# Patient Record
Sex: Male | Born: 1990 | Race: White | Hispanic: No | Marital: Single | Smoking: Never smoker
Health system: Southern US, Community
[De-identification: ages and names within clinical notes are randomized; demographics above are authoritative.]

---
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
## 2015-08-11 ENCOUNTER — Emergency Department (HOSPITAL_COMMUNITY)
Admission: EM | Admit: 2015-08-11 | Discharge: 2015-08-11 | Disposition: A | Discharge status: Home / Self Care | Payer: Self-pay | Attending: Emergency Medicine | Admitting: Emergency Medicine

## 2015-08-11 ENCOUNTER — Encounter (HOSPITAL_COMMUNITY): Payer: Self-pay | Admitting: *Deleted

## 2015-08-11 DIAGNOSIS — R42 Dizziness and giddiness: Secondary | ICD-10-CM | POA: Insufficient documentation

## 2015-08-11 DIAGNOSIS — E860 Dehydration: Secondary | ICD-10-CM | POA: Insufficient documentation

## 2015-08-11 LAB — CBC WITH DIFFERENTIAL/PLATELET
BASOS ABS: 0 10*3/uL (ref 0.0–0.1)
BASOS PCT: 0 %
Eosinophils Absolute: 0.1 10*3/uL (ref 0.0–0.7)
Eosinophils Relative: 2 %
HEMATOCRIT: 41.4 % (ref 39.0–52.0)
Hemoglobin: 14.9 g/dL (ref 13.0–17.0)
Lymphocytes Relative: 40 %
Lymphs Abs: 2.4 10*3/uL (ref 0.7–4.0)
MCH: 32.6 pg (ref 26.0–34.0)
MCHC: 36 g/dL (ref 30.0–36.0)
MCV: 90.6 fL (ref 78.0–100.0)
MONO ABS: 0.6 10*3/uL (ref 0.1–1.0)
Monocytes Relative: 10 %
NEUTROS ABS: 2.9 10*3/uL (ref 1.7–7.7)
Neutrophils Relative %: 48 %
PLATELETS: 200 10*3/uL (ref 150–400)
RBC: 4.57 MIL/uL (ref 4.22–5.81)
RDW: 12.1 % (ref 11.5–15.5)
WBC: 6 10*3/uL (ref 4.0–10.5)

## 2015-08-11 LAB — COMPREHENSIVE METABOLIC PANEL
ALBUMIN: 4.5 g/dL (ref 3.5–5.0)
ALK PHOS: 46 U/L (ref 38–126)
ALT: 10 U/L — ABNORMAL LOW (ref 17–63)
ANION GAP: 4 — AB (ref 5–15)
AST: 21 U/L (ref 15–41)
BILIRUBIN TOTAL: 1.1 mg/dL (ref 0.3–1.2)
BUN: 13 mg/dL (ref 6–20)
CALCIUM: 8.8 mg/dL — AB (ref 8.9–10.3)
CO2: 28 mmol/L (ref 22–32)
Chloride: 108 mmol/L (ref 101–111)
Creatinine, Ser: 0.92 mg/dL (ref 0.61–1.24)
GLUCOSE: 80 mg/dL (ref 65–99)
Potassium: 3.7 mmol/L (ref 3.5–5.1)
Sodium: 140 mmol/L (ref 135–145)
TOTAL PROTEIN: 7.7 g/dL (ref 6.5–8.1)

## 2015-08-11 LAB — URINALYSIS, ROUTINE W REFLEX MICROSCOPIC
BILIRUBIN URINE: NEGATIVE
Glucose, UA: NEGATIVE mg/dL
HGB URINE DIPSTICK: NEGATIVE
Ketones, ur: NEGATIVE mg/dL
Leukocytes, UA: NEGATIVE
Nitrite: NEGATIVE
PROTEIN: NEGATIVE mg/dL
Specific Gravity, Urine: >1.03 — ABNORMAL HIGH (ref 1.005–1.030)
UROBILINOGEN UA: 1 mg/dL (ref 0.0–1.0)
pH: 6 (ref 5.0–8.0)

## 2015-08-11 LAB — TROPONIN I: Troponin I: <0.03 ng/mL (ref <0.031)

## 2015-08-11 NOTE — ED Notes (Signed)
Patient in gown and placed on cardiac monitoring. EKG done and given to EDP Dr Lynelle Doctor

## 2015-08-11 NOTE — Discharge Instructions (Signed)
Drink a bottle of Gatorade tonight and again in the morning. If you are not feeling better after that you may want to be reevaluated. Return to the ED if you get worse such as headache, falling, vomiting, or change in vision.   Dizziness Dizziness is a common problem. It is a feeling of unsteadiness or light-headedness. You may feel like you are about to faint. Dizziness can lead to injury if you stumble or fall. A person of any age group can suffer from dizziness, but dizziness is more common in older adults. CAUSES  Dizziness can be caused by many different things, including:  Middle ear problems.  Standing for too long.  Infections.  An allergic reaction.  Aging.  An emotional response to something, such as the sight of blood.  Side effects of medicines.  Tiredness.  Problems with circulation or blood pressure.  Excessive use of alcohol or medicines, or illegal drug use.  Breathing too fast (hyperventilation).  An irregular heart rhythm (arrhythmia).  A low red blood cell count (anemia).  Pregnancy.  Vomiting, diarrhea, fever, or other illnesses that cause body fluid loss (dehydration).  Diseases or conditions such as Parkinson's disease, high blood pressure (hypertension), diabetes, and thyroid problems.  Exposure to extreme heat. DIAGNOSIS  Your health care provider will ask about your symptoms, perform a physical exam, and perform an electrocardiogram (ECG) to record the electrical activity of your heart. Your health care provider may also perform other heart or blood tests to determine the cause of your dizziness. These may include:  Transthoracic echocardiogram (TTE). During echocardiography, sound waves are used to evaluate how blood flows through your heart.  Transesophageal echocardiogram (TEE).  Cardiac monitoring. This allows your health care provider to monitor your heart rate and rhythm in real time.  Holter monitor. This is a portable device that  records your heartbeat and can help diagnose heart arrhythmias. It allows your health care provider to track your heart activity for several days if needed.  Stress tests by exercise or by giving medicine that makes the heart beat faster. TREATMENT  Treatment of dizziness depends on the cause of your symptoms and can vary greatly. HOME CARE INSTRUCTIONS   Drink enough fluids to keep your urine clear or pale yellow. This is especially important in very hot weather. In older adults, it is also important in cold weather.  Take your medicine exactly as directed if your dizziness is caused by medicines. When taking blood pressure medicines, it is especially important to get up slowly.  Rise slowly from chairs and steady yourself until you feel okay.  In the morning, first sit up on the side of the bed. When you feel okay, stand slowly while holding onto something until you know your balance is fine.  Move your legs often if you need to stand in one place for a long time. Tighten and relax your muscles in your legs while standing.  Have someone stay with you for 1-2 days if dizziness continues to be a problem. Do this until you feel you are well enough to stay alone. Have the person call your health care provider if he or she notices changes in you that are concerning.  Do not drive or use heavy machinery if you feel dizzy.  Do not drink alcohol. SEEK IMMEDIATE MEDICAL CARE IF:   Your dizziness or light-headedness gets worse.  You feel nauseous or vomit.  You have problems talking, walking, or using your arms, hands, or legs.  You  feel weak.  You are not thinking clearly or you have trouble forming sentences. It may take a friend or family member to notice this.  You have chest pain, abdominal pain, shortness of breath, or sweating.  Your vision changes.  You notice any bleeding.  You have side effects from medicine that seems to be getting worse rather than better. MAKE SURE YOU:     Understand these instructions.  Will watch your condition.  Will get help right away if you are not doing well or get worse. Document Released: 05/03/2001 Document Revised: 11/12/2013 Document Reviewed: 05/27/2011 [facility] Patient Information 2015 [place], [place]. This information is not intended to replace advice given to you by your health care provider. Make sure you discuss any questions you have with your health care provider.

## 2015-08-11 NOTE — ED Provider Notes (Signed)
CSN: [id number]     Arrival date & time 08/11/15  0125 History   First MD Initiated Contact with Patient 08/11/15 0145     Chief Complaint  Patient presents with  . Dizziness     (Consider location/radiation/quality/duration/timing/severity/associated sxs/prior Treatment) HPI when I go into room and asked the patient was going on he states "not much". 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 He states he feels that way constantly and it does not matter if he standing or laying down. He denies any feeling of spinning but states he feels like he's off balance. His mother states he was at work [employer] and he got clammy and pale when he felt like he was going to pass out. They state their work environment is not hot. He ate like he normally does during the day today. He denies headache, chest pain, shortness of breath, nausea, vomiting, or diarrhea. He does report feeling shaky. He has not been around anybody else who is ill.  PCP none  History reviewed. No pertinent past medical history. History reviewed. No pertinent past surgical history. No family history on file. Social History  Substance Use Topics  . Smoking status: Never Smoker   . Smokeless tobacco: None  . Alcohol Use: Yes  employed Lives with mother  Review of Systems  All other systems reviewed and are negative.     Allergies  Review of patient's allergies indicates no known allergies.  Home Medications   Prior to Admission medications   Not on File   BP 131/94 mmHg  Pulse 66  Temp(Src) 98.5 F (36.9 C) (Oral)  Resp 18  Ht  (1.676 m)  Wt 140 lb 9 oz (63.759 kg)  BMI 22.70 kg/m2  SpO2 99%  Vital signs normal   Physical Exam  Constitutional: He is oriented to person, place, and time. He appears well-developed and well-nourished.  Non-toxic appearance. He does not appear ill. No distress.  HENT:  Head: Normocephalic and  atraumatic.  Right Ear: External ear normal.  Left Ear: External ear normal.  Nose: Nose normal. No mucosal edema or rhinorrhea.  Mouth/Throat: Mucous membranes are normal. No dental abscesses or uvula swelling.  Tongue slightly dry  Eyes: Conjunctivae and EOM are normal. Pupils are equal, round, and reactive to light.  Neck: Normal range of motion and full passive range of motion without pain. Neck supple.  Cardiovascular: Normal rate, regular rhythm and normal heart sounds.  Exam reveals no gallop and no friction rub.   No murmur heard. Pulmonary/Chest: Effort normal and breath sounds normal. No respiratory distress. He has no wheezes. He has no rhonchi. He has no rales. He exhibits no tenderness and no crepitus.  Abdominal: Soft. Normal appearance and bowel sounds are normal. He exhibits no distension. There is no tenderness. There is no rebound and no guarding.  Musculoskeletal: Normal range of motion. He exhibits no edema or tenderness.  Moves all extremities well.   Neurological: He is alert and oriented to person, place, and time. He has normal strength. No cranial nerve deficit.  Skin: Skin is warm, dry and intact. No rash noted. No erythema. There is pallor.  Psychiatric: He has a normal mood and affect. His speech is normal and behavior is normal. His mood appears not anxious.  Nursing note and vitals reviewed.   ED Course  Procedures (including critical care time)   02:07:16 Orthostatic Vital  Signs Orthostatic Lying  - BP- Lying: 127/85 mmHg ; Pulse- Lying: 64  Orthostatic Sitting - BP- Sitting: 134/93 mmHg ; Pulse- Sitting: 72  Orthostatic Standing at 0 minutes - BP- Standing at 0 minutes: 135/95 mmHg ; Pulse- Standing at 0 minutes: 72     Patient was given his test results. Does appear to have concentrated urine consistent with dehydration. He was given the option of getting an IV and getting IV fluids in the ED or going home and drinking Gatorade. He states he has Gatorade  at home and prefers to do that. At this point I do not see any other alarming signs, although he states he feels off balance his gait and coordination are normal. CT head considered but not done. Patient was playing games on his cell phone when I go back to the room.   Labs Review Results for orders placed or performed during the hospital encounter of 08/11/15  Comprehensive metabolic panel  Result Value Ref Range   Sodium 140 135 - 145 mmol/L   Potassium 3.7 3.5 - 5.1 mmol/L   Chloride 108 101 - 111 mmol/L   CO2 28 22 - 32 mmol/L   Glucose, Bld 80 65 - 99 mg/dL   BUN 13 6 - 20 mg/dL   Creatinine, Ser 1.61 0.61 - 1.24 mg/dL   Calcium 8.8 (L) 8.9 - 10.3 mg/dL   Total Protein 7.7 6.5 - 8.1 g/dL   Albumin 4.5 3.5 - 5.0 g/dL   AST 21 15 - 41 U/L   ALT 10 (L) 17 - 63 U/L   Alkaline Phosphatase 46 38 - 126 U/L   Total Bilirubin 1.1 0.3 - 1.2 mg/dL   GFR calc non Af Amer >60 >60 mL/min   GFR calc Af Amer >60 >60 mL/min   Anion gap 4 (L) 5 - 15  CBC with Differential  Result Value Ref Range   WBC 6.0 4.0 - 10.5 K/uL   RBC 4.57 4.22 - 5.81 MIL/uL   Hemoglobin 14.9 13.0 - 17.0 g/dL   HCT 09.6 04.5 - 40.9 %   MCV 90.6 78.0 - 100.0 fL   MCH 32.6 26.0 - 34.0 pg   MCHC 36.0 30.0 - 36.0 g/dL   RDW 81.1 91.4 - 78.2 %   Platelets 200 150 - 400 K/uL   Neutrophils Relative % 48 %   Neutro Abs 2.9 1.7 - 7.7 K/uL   Lymphocytes Relative 40 %   Lymphs Abs 2.4 0.7 - 4.0 K/uL   Monocytes Relative 10 %   Monocytes Absolute 0.6 0.1 - 1.0 K/uL   Eosinophils Relative 2 %   Eosinophils Absolute 0.1 0.0 - 0.7 K/uL   Basophils Relative 0 %   Basophils Absolute 0.0 0.0 - 0.1 K/uL  Troponin I  Result Value Ref Range   Troponin I <0.03 <0.031 ng/mL  Urinalysis, Routine w reflex microscopic  Result Value Ref Range   Color, Urine YELLOW YELLOW   APPearance CLEAR CLEAR   Specific Gravity, Urine >1.030 (H) 1.005 - 1.030   pH 6.0 5.0 - 8.0   Glucose, UA NEGATIVE NEGATIVE mg/dL   Hgb urine dipstick  NEGATIVE NEGATIVE   Bilirubin Urine NEGATIVE NEGATIVE   Ketones, ur NEGATIVE NEGATIVE mg/dL   Protein, ur NEGATIVE NEGATIVE mg/dL   Urobilinogen, UA 1.0 0.0 - 1.0 mg/dL   Nitrite NEGATIVE NEGATIVE   Leukocytes, UA NEGATIVE NEGATIVE   Laboratory interpretation all normal except concentrated urine consistent with dehydration     Imaging  Review No results found. I have personally reviewed and evaluated these images and lab results as part of my medical decision-making.   EKG Interpretation   Date/Time:  Tuesday August 11 2015 02:04:59 EDT Ventricular Rate:  69 PR Interval:  124 QRS Duration: 88 QT Interval:  380 QTC Calculation: 407 R Axis:   81 Text Interpretation:  Sinus rhythm Atrial premature complex Otherwise  within normal limits No old tracing to compare Confirmed by KNAPP  MD-I,  IVA ([id number]) on 08/11/2015 2:49:31 AM      MDM   Final diagnoses:  Dizziness  Dehydration   Plan discharge  Devoria Albe, MD, Concha Pyo, MD 08/11/15 [phone]

## 2015-08-11 NOTE — ED Notes (Signed)
Pt alert & oriented x4, stable gait. Patient given discharge instructions, paperwork & prescription(s). Patient  instructed to stop at the registration desk to finish any additional paperwork. Patient verbalized understanding. Pt left department w/ no further questions. 

## 2015-08-11 NOTE — ED Notes (Signed)
Pt states he has had dizziness since getting up yesterday @ 1630. Pt ambulated to treatment room w/ stable gait.

## 2016-09-27 ENCOUNTER — Encounter (HOSPITAL_COMMUNITY): Payer: Self-pay | Admitting: Emergency Medicine

## 2016-09-27 ENCOUNTER — Emergency Department (HOSPITAL_COMMUNITY): Payer: BLUE CROSS/BLUE SHIELD

## 2016-09-27 ENCOUNTER — Emergency Department (HOSPITAL_COMMUNITY)
Admission: EM | Admit: 2016-09-27 | Discharge: 2016-09-27 | Disposition: A | Discharge status: Home / Self Care | Payer: BLUE CROSS/BLUE SHIELD | Attending: Emergency Medicine | Admitting: Emergency Medicine

## 2016-09-27 DIAGNOSIS — R109 Unspecified abdominal pain: Secondary | ICD-10-CM

## 2016-09-27 DIAGNOSIS — R110 Nausea: Secondary | ICD-10-CM | POA: Diagnosis not present

## 2016-09-27 DIAGNOSIS — R1031 Right lower quadrant pain: Secondary | ICD-10-CM | POA: Diagnosis not present

## 2016-09-27 DIAGNOSIS — Z791 Long term (current) use of non-steroidal anti-inflammatories (NSAID): Secondary | ICD-10-CM | POA: Insufficient documentation

## 2016-09-27 DIAGNOSIS — R1084 Generalized abdominal pain: Secondary | ICD-10-CM | POA: Diagnosis present

## 2016-09-27 DIAGNOSIS — R1032 Left lower quadrant pain: Secondary | ICD-10-CM | POA: Diagnosis not present

## 2016-09-27 LAB — COMPREHENSIVE METABOLIC PANEL
ALT: 11 U/L — AB (ref 17–63)
AST: 18 U/L (ref 15–41)
Albumin: 4.6 g/dL (ref 3.5–5.0)
Alkaline Phosphatase: 45 U/L (ref 38–126)
Anion gap: 7 (ref 5–15)
BILIRUBIN TOTAL: 1.9 mg/dL — AB (ref 0.3–1.2)
BUN: 12 mg/dL (ref 6–20)
CALCIUM: 9.1 mg/dL (ref 8.9–10.3)
CHLORIDE: 102 mmol/L (ref 101–111)
CO2: 26 mmol/L (ref 22–32)
CREATININE: 0.94 mg/dL (ref 0.61–1.24)
Glucose, Bld: 106 mg/dL — ABNORMAL HIGH (ref 65–99)
Potassium: 4.3 mmol/L (ref 3.5–5.1)
Sodium: 135 mmol/L (ref 135–145)
TOTAL PROTEIN: 7.6 g/dL (ref 6.5–8.1)

## 2016-09-27 LAB — CBC
HCT: 44.3 % (ref 39.0–52.0)
Hemoglobin: 15.9 g/dL (ref 13.0–17.0)
MCH: 32.9 pg (ref 26.0–34.0)
MCHC: 35.9 g/dL (ref 30.0–36.0)
MCV: 91.5 fL (ref 78.0–100.0)
PLATELETS: 206 10*3/uL (ref 150–400)
RBC: 4.84 MIL/uL (ref 4.22–5.81)
RDW: 12.2 % (ref 11.5–15.5)
WBC: 10.4 10*3/uL (ref 4.0–10.5)

## 2016-09-27 LAB — URINALYSIS, ROUTINE W REFLEX MICROSCOPIC
Bilirubin Urine: NEGATIVE
GLUCOSE, UA: NEGATIVE mg/dL
Hgb urine dipstick: NEGATIVE
KETONES UR: NEGATIVE mg/dL
LEUKOCYTES UA: NEGATIVE
NITRITE: NEGATIVE
PROTEIN: NEGATIVE mg/dL
Specific Gravity, Urine: 1.015 (ref 1.005–1.030)
pH: 7 (ref 5.0–8.0)

## 2016-09-27 LAB — LIPASE, BLOOD: LIPASE: 19 U/L (ref 11–51)

## 2016-09-27 MED ORDER — IOPAMIDOL (ISOVUE-300) INJECTION 61%
100.0000 mL | Freq: Once | INTRAVENOUS | Status: AC | PRN
  Administered 2016-09-27: 100 mL via INTRAVENOUS

## 2016-09-27 MED ORDER — IOPAMIDOL (ISOVUE-300) INJECTION 61%
INTRAVENOUS | Status: AC
  Administered 2016-09-27: 30 mL
  Filled 2016-09-27: qty 30

## 2016-09-27 MED ORDER — ONDANSETRON HCL 4 MG PO TABS: 4.0000 mg | ORAL_TABLET | Freq: Three times a day (TID) | ORAL | 0 refills | Status: AC | PRN

## 2016-09-27 NOTE — ED Triage Notes (Signed)
PT c/o LLQ abdominal pain with nausea, bloating x3 days. PT states no BM x2 days. Urgent care sent patient to ED for eval d/t rebound tenderness to LLQ abdomen.

## 2016-09-27 NOTE — Discharge Instructions (Signed)
Take the prescription as directed.  Increase your fluid intake (ie:  Gatoraide) for the next few days.  Eat a bland diet and advance to your regular diet slowly as you can tolerate it. Call your regular medical doctor tomorrow to schedule a follow up appointment this week.  Return to the Emergency Department immediately if not improving (or even worsening) despite taking the medicines as prescribed, any black or bloody stool or vomit, if you develop a fever over "101," or for any other concerns.

## 2016-09-27 NOTE — ED Provider Notes (Signed)
[facility]-EMERGENCY DEPT Provider Note   CSN: [id number] Arrival date & time: 09/27/16  1512     History   Chief Complaint Chief Complaint  Patient presents with  . Abdominal Pain    HPI Howard Rivera is a 25 y.o. male.  HPI  Pt was seen at 1925.  Per pt, c/o gradual onset and worsening of persistent generalized abd "pain" for the past 2 to 3 days.  Has been associated with nausea and multiple intermittent episodes of diarrhea 3 days ago. States he has not had a BM since then.  Describes the abd pain as "bloating" and "gurgling."  Pt states he was evaluated by a local [facility], and then sent to the ED because he had "rebound tenderness" in his lower abd. Denies vomiting, no fevers, no back pain, no rash, no CP/SOB, no black or blood in stools.      History reviewed. No pertinent past medical history.  There are no active problems to display for this patient.   History reviewed. No pertinent surgical history.     Home Medications    Prior to Admission medications   Not on File    Family History History reviewed. No pertinent family history.  Social History Social History  Substance Use Topics  . Smoking status: Never Smoker  . Smokeless tobacco: Never Used  . Alcohol use Yes     Comment: occassionally     Allergies   Patient has no known allergies.   Review of Systems Review of Systems ROS: Statement: All systems negative except as marked or noted in the HPI; Constitutional: Negative for fever and chills. ; ; Eyes: Negative for eye pain, redness and discharge. ; ; ENMT: Negative for ear pain, hoarseness, nasal congestion, sinus pressure and sore throat. ; ; Cardiovascular: Negative for chest pain, palpitations, diaphoresis, dyspnea and peripheral edema. ; ; Respiratory: Negative for cough, wheezing and stridor. ; ; Gastrointestinal: +nausea, diarrhea, abd pain. Negative for vomiting, blood in stool, hematemesis, jaundice and rectal bleeding. . ; ; Genitourinary:  Negative for dysuria, flank pain and hematuria. ; ; Genital:  No penile drainage or rash, no testicular pain or swelling, no scrotal rash or swelling. ;; Musculoskeletal: Negative for back pain and neck pain. Negative for swelling and trauma.; ; Skin: Negative for pruritus, rash, abrasions, blisters, bruising and skin lesion.; ; Neuro: Negative for headache, lightheadedness and neck stiffness. Negative for weakness, altered level of consciousness, altered mental status, extremity weakness, paresthesias, involuntary movement, seizure and syncope.       Physical Exam Updated Vital Signs BP 132/83   Pulse 64   Temp 98.1 F (36.7 C) (Oral)   Resp 16   Ht 5\' 5"  (1.651 m)   Wt 130 lb (59 kg)   SpO2 99%   BMI 21.63 kg/m   Physical Exam 1930: Physical examination:  Nursing notes reviewed; Vital signs and O2 SAT reviewed;  Constitutional: Well developed, Well nourished, Well hydrated, In no acute distress; Head:  Normocephalic, atraumatic; Eyes: EOMI, PERRL, No scleral icterus; ENMT: Mouth and pharynx normal, Mucous membranes moist; Neck: Supple, Full range of motion, No lymphadenopathy; Cardiovascular: Regular rate and rhythm, No murmur, rub, or gallop; Respiratory: Breath sounds clear & equal bilaterally, No rales, rhonchi, wheezes.  Speaking full sentences with ease, Normal respiratory effort/excursion; Chest: Nontender, Movement normal; Abdomen: Soft, +RLQ and LLQ tenderness to palp.  Nondistended, Normal bowel sounds; Genitourinary: No CVA tenderness; Extremities: Pulses normal, No tenderness, No edema, No calf edema or asymmetry.; Neuro:  AA&Ox3, Major CN grossly intact.  Speech clear. No gross focal motor or sensory deficits in extremities.; Skin: Color normal, Warm, Dry.   ED Treatments / Results  Labs (all labs ordered are listed, but only abnormal results are displayed)   EKG  EKG Interpretation None       Radiology   Procedures Procedures (including critical care  time)  Medications Ordered in ED Medications - No data to display   Initial Impression / Assessment and Plan / ED Course  I have reviewed the triage vital signs and the nursing notes.  Pertinent labs & imaging results that were available during my care of the patient were reviewed by me and considered in my medical decision making (see chart for details).  MDM Reviewed: previous chart, nursing note and vitals Reviewed previous: labs Interpretation: labs and CT scan   Results for orders placed or performed during the hospital encounter of 09/27/16  Lipase, blood  Result Value Ref Range   Lipase 19 11 - 51 U/L  Comprehensive metabolic panel  Result Value Ref Range   Sodium 135 135 - 145 mmol/L   Potassium 4.3 3.5 - 5.1 mmol/L   Chloride 102 101 - 111 mmol/L   CO2 26 22 - 32 mmol/L   Glucose, Bld 106 (H) 65 - 99 mg/dL   BUN 12 6 - 20 mg/dL   Creatinine, Ser 9.560.94 0.61 - 1.24 mg/dL   Calcium 9.1 8.9 - 21.310.3 mg/dL   Total Protein 7.6 6.5 - 8.1 g/dL   Albumin 4.6 3.5 - 5.0 g/dL   AST 18 15 - 41 U/L   ALT 11 (L) 17 - 63 U/L   Alkaline Phosphatase 45 38 - 126 U/L   Total Bilirubin 1.9 (H) 0.3 - 1.2 mg/dL   GFR calc non Af Amer >60 >60 mL/min   GFR calc Af Amer >60 >60 mL/min   Anion gap 7 5 - 15  CBC  Result Value Ref Range   WBC 10.4 4.0 - 10.5 K/uL   RBC 4.84 4.22 - 5.81 MIL/uL   Hemoglobin 15.9 13.0 - 17.0 g/dL   HCT 08.644.3 57.839.0 - 46.952.0 %   MCV 91.5 78.0 - 100.0 fL   MCH 32.9 26.0 - 34.0 pg   MCHC 35.9 30.0 - 36.0 g/dL   RDW 62.912.2 52.811.5 - 41.315.5 %   Platelets 206 150 - 400 K/uL  Urinalysis, Routine w reflex microscopic  Result Value Ref Range   Color, Urine YELLOW YELLOW   APPearance CLEAR CLEAR   Specific Gravity, Urine 1.015 1.005 - 1.030   pH 7.0 5.0 - 8.0   Glucose, UA NEGATIVE NEGATIVE mg/dL   Hgb urine dipstick NEGATIVE NEGATIVE   Bilirubin Urine NEGATIVE NEGATIVE   Ketones, ur NEGATIVE NEGATIVE mg/dL   Protein, ur NEGATIVE NEGATIVE mg/dL   Nitrite NEGATIVE  NEGATIVE   Leukocytes, UA NEGATIVE NEGATIVE   Ct Abdomen Pelvis W Contrast Result Date: 09/27/2016 CLINICAL DATA:  Left lower quadrant pain for several days, initial encounter EXAM: CT ABDOMEN AND PELVIS WITH CONTRAST TECHNIQUE: Multidetector CT imaging of the abdomen and pelvis was performed using the standard protocol following bolus administration of intravenous contrast. CONTRAST:  100mL ISOVUE-300 IOPAMIDOL (ISOVUE-300) INJECTION 61% COMPARISON:  None. FINDINGS: Lower chest: No acute abnormality. Hepatobiliary: No focal liver abnormality is seen. No gallstones, gallbladder wall thickening, or biliary dilatation. Pancreas: Unremarkable. No pancreatic ductal dilatation or surrounding inflammatory changes. Spleen: Normal in size without focal abnormality. Adrenals/Urinary Tract: Adrenal glands are unremarkable. Kidneys  are normal, without renal calculi, focal lesion, or hydronephrosis. Bladder is unremarkable. Stomach/Bowel: Stomach is within normal limits. Appendix appears normal. No evidence of bowel wall thickening, distention, or inflammatory changes. Vascular/Lymphatic: No significant vascular findings are present. No enlarged abdominal or pelvic lymph nodes. Reproductive: Prostate is unremarkable. Other: No abdominal wall hernia or abnormality. No abdominopelvic ascites. Musculoskeletal: No acute or significant osseous findings. IMPRESSION: No acute abnormality noted. Electronically Signed   By: Alcide CleverMark  Lukens M.D.   On: 09/27/2016 20:50    2055:  Pt has tol PO well while in the ED without N/V.  No stooling while in the ED. Workup reassuring. Pt wants to go home now. Tx symptomatically at this time. Dx and testing d/w pt.  Questions answered.  Verb understanding, agreeable to d/c home with outpt f/u.    Final Clinical Impressions(s) / ED Diagnoses   Final diagnoses:  None    New Prescriptions New Prescriptions   No medications on file     Samuel JesterKathleen Nohelani Benning, DO 10/01/16 2122

## 2017-09-08 IMAGING — CT CT ABD-PELV W/ CM
2 of 4 series · 16 of 46 positions shown, 18 images · IV contrast (APPLIED)
Comparison: None.

CLINICAL DATA: Left lower quadrant pain for several days, initial
encounter

EXAM:
CT ABDOMEN AND PELVIS WITH CONTRAST
TECHNIQUE: Multidetector CT imaging of the abdomen and pelvis was performed
using the standard protocol following bolus administration of
intravenous contrast.
CONTRAST:  100mL HIQ0KX-6SS IOPAMIDOL (HIQ0KX-6SS) INJECTION 61%

[Series 2: axial st · axial · 0.66mm/px · z∈[-430,-0]mm · 13 of 94 slices shown, 15 images]
[im 4/94  soft-tissue]
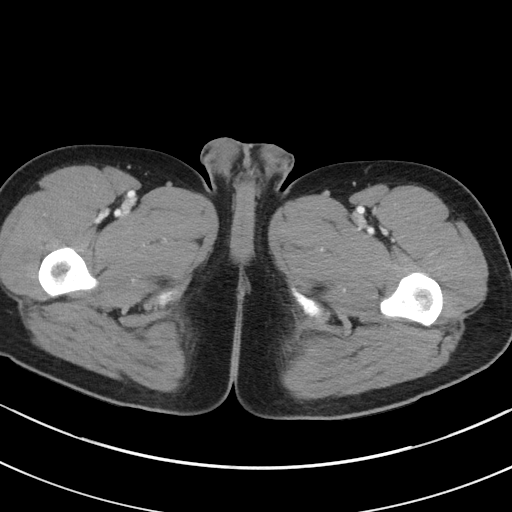
[im 4/94  bone]
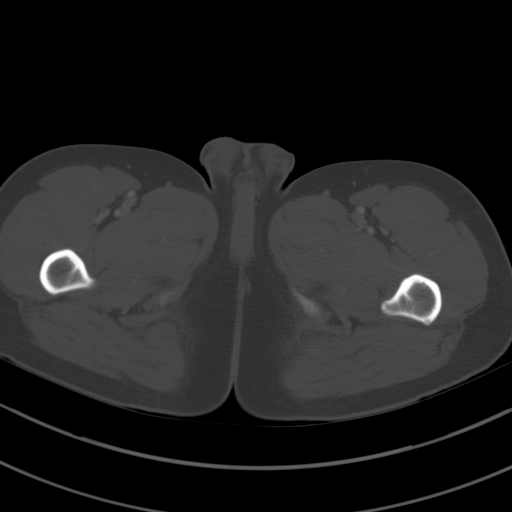
[im 12/94  soft-tissue]
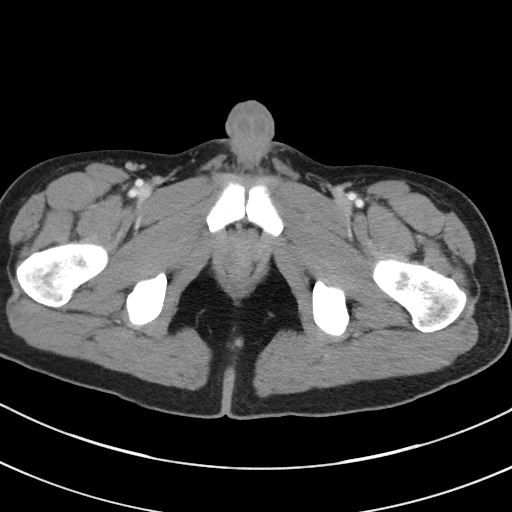
[im 20/94  soft-tissue]
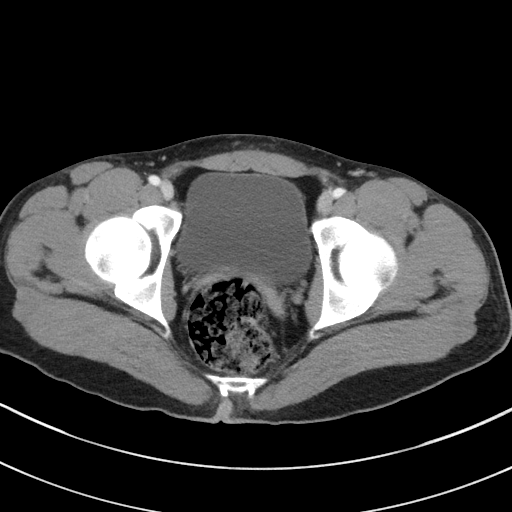
[im 28/94  soft-tissue]
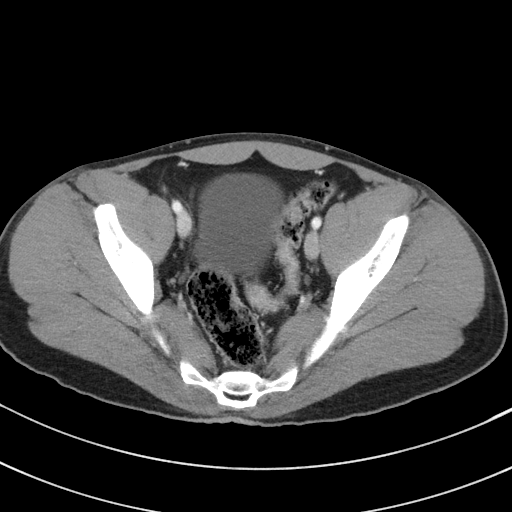
[im 32/94  soft-tissue]
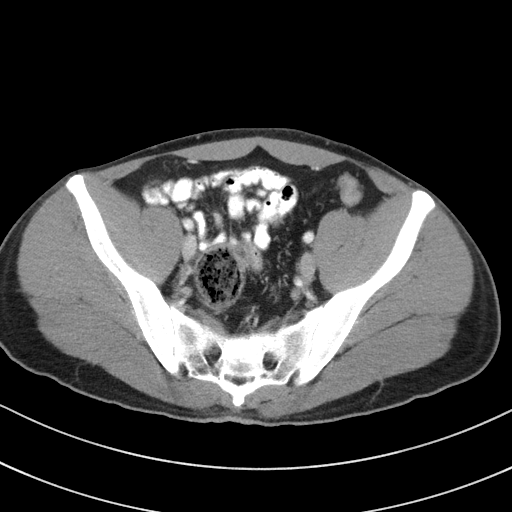
[im 39/94  soft-tissue]
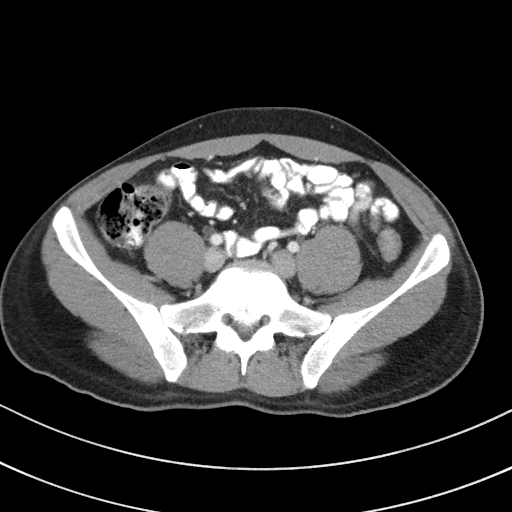
[im 47/94  soft-tissue]
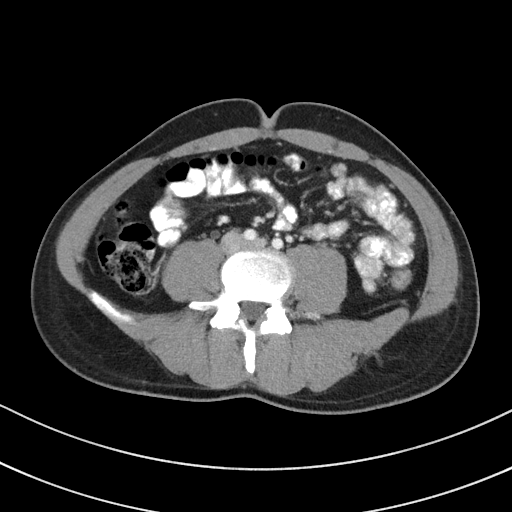
[im 55/94  soft-tissue]
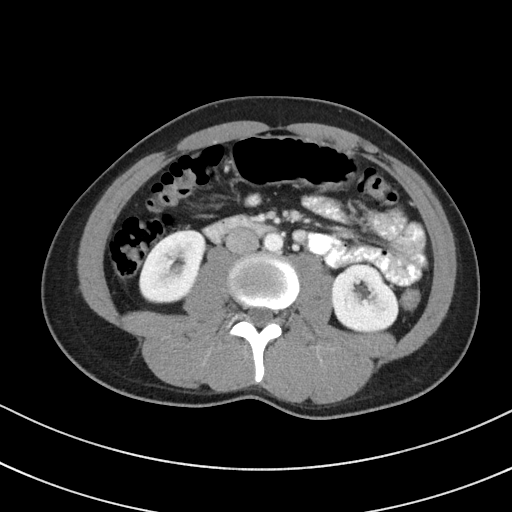
[im 63/94  soft-tissue]
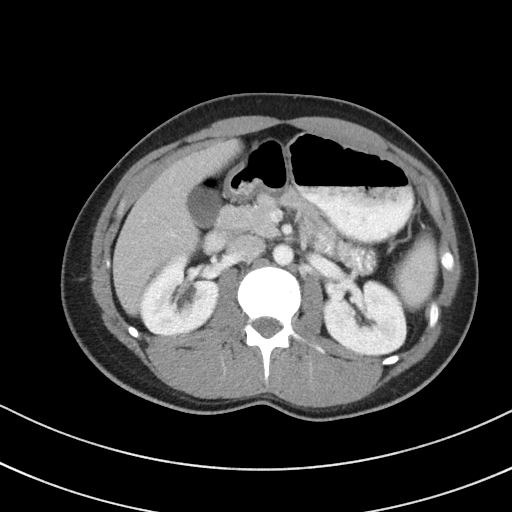
[im 63/94  bone]
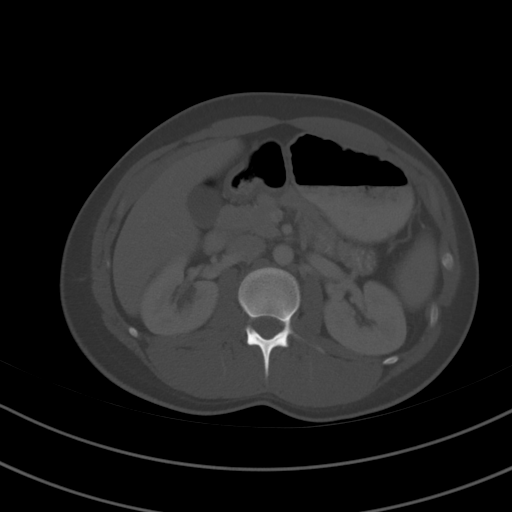
[im 66/94  soft-tissue]
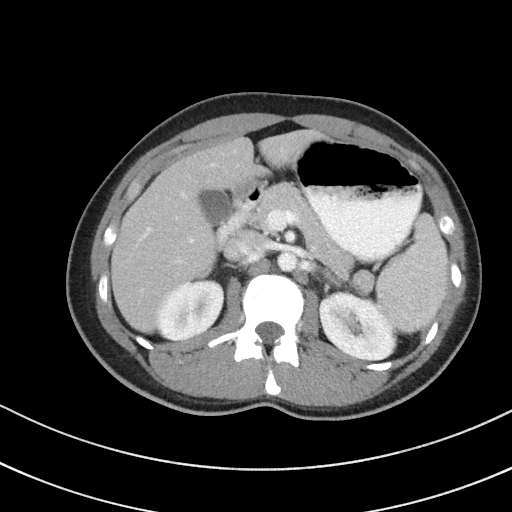
[im 74/94  soft-tissue]
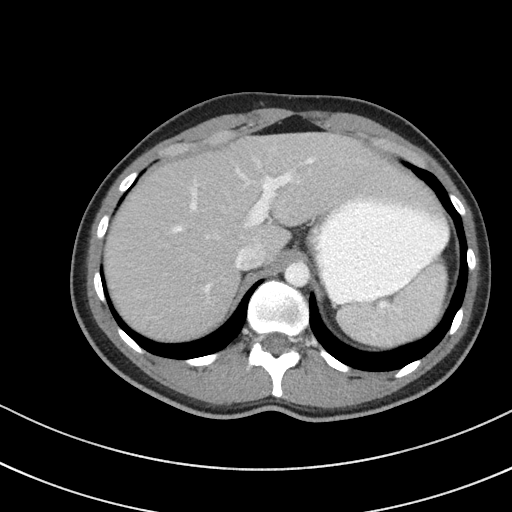
[im 82/94  soft-tissue]
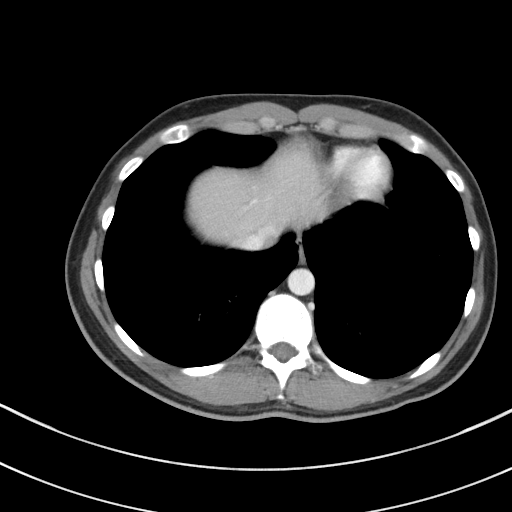
[im 90/94  soft-tissue]
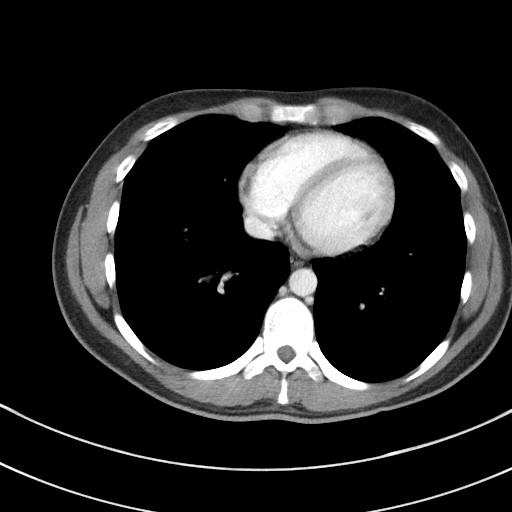

[Series 4: coronal st · coronal · 0.72mm/px · 3 of 74 slices shown]
[im 25/74  soft-tissue]
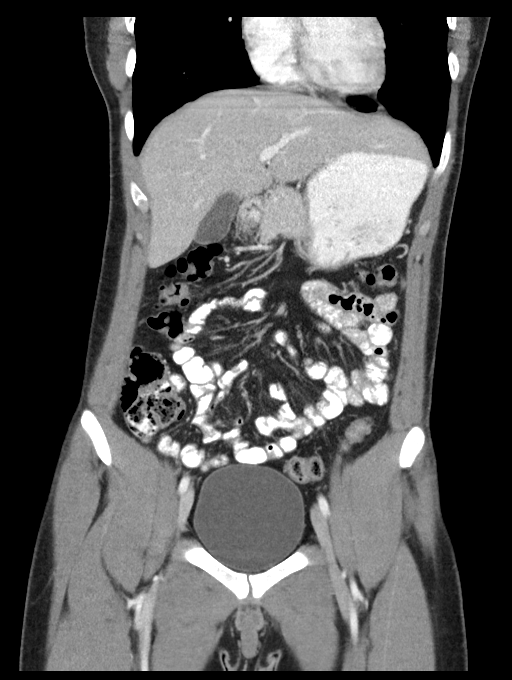
[im 33/74  soft-tissue]
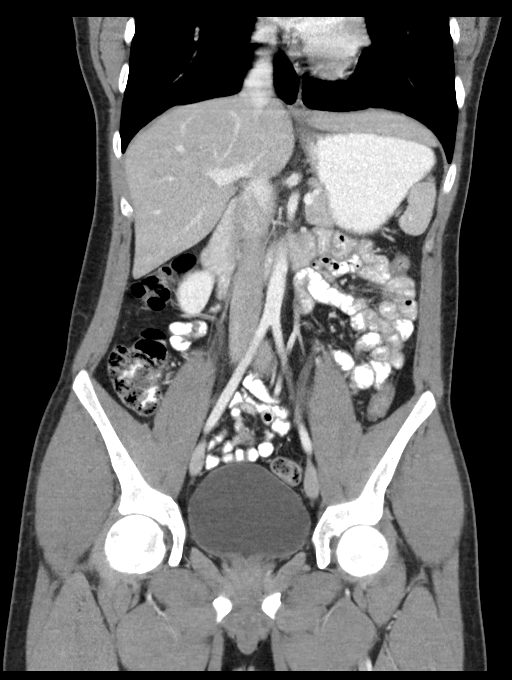
[im 41/74  soft-tissue]
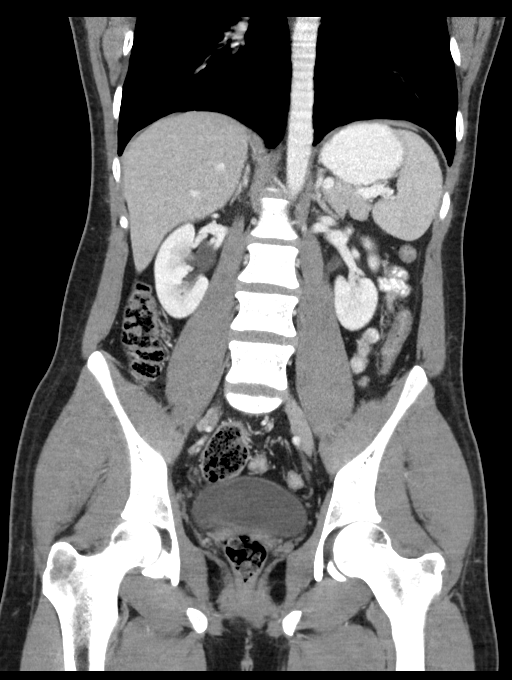

[16 of 46 positions shown; findings below may reference images not displayed]

FINDINGS: Lower chest: No acute abnormality.

Hepatobiliary: No focal liver abnormality is seen. No gallstones,
gallbladder wall thickening, or biliary dilatation.

Pancreas: Unremarkable. No pancreatic ductal dilatation or
surrounding inflammatory changes.

Spleen: Normal in size without focal abnormality.

Adrenals/Urinary Tract: Adrenal glands are unremarkable. Kidneys are
normal, without renal calculi, focal lesion, or hydronephrosis.
Bladder is unremarkable.

Stomach/Bowel: Stomach is within normal limits. Appendix appears
normal. No evidence of bowel wall thickening, distention, or
inflammatory changes.

Vascular/Lymphatic: No significant vascular findings are present. No
enlarged abdominal or pelvic lymph nodes.

Reproductive: Prostate is unremarkable.

Other: No abdominal wall hernia or abnormality. No abdominopelvic
ascites.

Musculoskeletal: No acute or significant osseous findings.
IMPRESSION: No acute abnormality noted.
